# Patient Record
Sex: Female | Born: 1990 | Race: White | Hispanic: No | Marital: Single | Smoking: Former smoker
Health system: Southern US, Community
[De-identification: ages and names within clinical notes are randomized; demographics above are authoritative.]

## PROBLEM LIST (undated history)

## (undated) DIAGNOSIS — R51 Headache: Secondary | ICD-10-CM

## (undated) DIAGNOSIS — N760 Acute vaginitis: Secondary | ICD-10-CM

## (undated) DIAGNOSIS — B9689 Other specified bacterial agents as the cause of diseases classified elsewhere: Secondary | ICD-10-CM

## (undated) DIAGNOSIS — R519 Headache, unspecified: Secondary | ICD-10-CM

## (undated) HISTORY — PX: NO PAST SURGERIES: SHX2092

## (undated) HISTORY — DX: Acute vaginitis: N76.0

## (undated) HISTORY — DX: Other specified bacterial agents as the cause of diseases classified elsewhere: B96.89

## (undated) HISTORY — DX: Headache: R51

## (undated) HISTORY — DX: Headache, unspecified: R51.9

---
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
## 2008-01-01 ENCOUNTER — Inpatient Hospital Stay: Payer: Self-pay | Admitting: Psychiatry

## 2009-02-14 ENCOUNTER — Emergency Department: Payer: Self-pay | Admitting: Emergency Medicine

## 2010-12-20 ENCOUNTER — Emergency Department: Payer: Self-pay | Admitting: Emergency Medicine

## 2011-08-25 ENCOUNTER — Emergency Department: Payer: Self-pay | Admitting: Emergency Medicine

## 2018-08-03 ENCOUNTER — Ambulatory Visit: Payer: Self-pay | Admitting: Nurse Practitioner

## 2018-08-03 ENCOUNTER — Encounter: Payer: Self-pay | Admitting: Nurse Practitioner

## 2018-08-03 ENCOUNTER — Other Ambulatory Visit: Payer: Self-pay

## 2018-08-03 VITALS — BP 109/58 | HR 64 | Temp 99.2°F | Ht 60.0 in | Wt 126.4 lb

## 2018-08-03 DIAGNOSIS — G43829 Menstrual migraine, not intractable, without status migrainosus: Secondary | ICD-10-CM

## 2018-08-03 DIAGNOSIS — L02416 Cutaneous abscess of left lower limb: Secondary | ICD-10-CM

## 2018-08-03 DIAGNOSIS — Z7689 Persons encountering health services in other specified circumstances: Secondary | ICD-10-CM

## 2018-08-03 MED ORDER — BUTALBITAL-APAP-CAFFEINE 50-325-40 MG PO TABS: 1.0000 | ORAL_TABLET | Freq: Four times a day (QID) | ORAL | 1 refills | Status: AC | PRN

## 2018-08-03 MED ORDER — SULFAMETHOXAZOLE-TRIMETHOPRIM 800-160 MG PO TABS: 1.0000 | ORAL_TABLET | Freq: Two times a day (BID) | ORAL | 0 refills | Status: AC

## 2018-08-03 NOTE — Patient Instructions (Addendum)
Margaret Rivera,   Thank you for coming in to clinic today.  1. For your abscess - infected cyst/fluid filled sac. - START Bactrim 800-160 mg take one tablet twice (about every 12 hours) for 10 days. - Let me know if there is heat, redness, "mushy" consistency to the lump, or drainage from your wound.  This needs to be opened and drained.  2. For migraine headaches. - START Fioricet 1-2 tablets every 6 hours. Start with one tablet.  Next headache, may take 2 tabs for your first dose if 1 was not helpful.  - Do not drive with this medication.  Please schedule a follow-up appointment with Wilhelmina Mcardle, AGNP. Return 2-4 weeks if symptoms worsen or fail to improve.  If you have any other questions or concerns, please feel free to call the clinic or send a message through MyChart. You may also schedule an earlier appointment if necessary.  You will receive a survey after today's visit either digitally by e-mail or paper by [employer]. Your experiences and feedback matter to [place].  Please respond so we know how we are doing as we provide care for you.   Wilhelmina Mcardle, DNP, AGNP-BC Adult Gerontology Nurse Practitioner [facility], [facility]

## 2018-08-03 NOTE — Progress Notes (Signed)
Subjective:    Patient ID: Margaret Rivera, female    DOB: Feb 13, 1991, 27 y.o.   MRN: [id number]  Margaret Rivera is a 27 y.o. female presenting on 08/03/2018 for Establish Care (abscess on the back of the left calf x 2 mths. Pt states she thought it was a spider bite it look like a mosquito bite that got bigger w/ pus inside of it. Once it drained it went away and returned after 2-3 weeks. )   HPI Establish Care New Provider Pt last seen by [facility] about 2 years ago.  Obtain records for last PAP.    [place]cess [place] - looked like mosquito bite vs spider bite.  Leaked pus about 3-4 weeks ago and had resolution without any bump.   Has worsened again over last 2-3 weeks.  Now has hard lump again under skin without drainage.  Is concerned about infection.  Migraines Patient reports regular migraines that occur once per month with nausea.  She has other headaches that are mild 1-2 times per week.  Migraines are normally associated with her menses and occasionally with stress.  Past Medical History:  Diagnosis Date  . BV (bacterial vaginosis)   . Frequent headaches    Past Surgical History:  Procedure Laterality Date  . NO PAST SURGERIES       Social History   Socioeconomic History  . Marital status: Single    Spouse name: Not on file  . Number of children: Not on file  . Years of education: Not on file  . Highest education level: Some college, no degree  Occupational History  . Not on file  Social Needs  . Financial resource strain: Not on file  . Food insecurity:    Worry: Not on file    Inability: Not on file  . Transportation needs:    Medical: Not on file    Non-medical: Not on file  Tobacco Use  . Smoking status: Former Smoker    Years: 5.00    Last attempt to quit: 08/03/2013    Years since quitting: 5.0  . Smokeless tobacco: Never Used  Substance and Sexual Activity  . Alcohol use: Yes    Alcohol/week: 3.0  standard drinks    Types: 3 Cans of beer per week    Comment: socially drinks 2-3 beverages, 2-3 x per month  . Drug use: Not Currently    Types: Marijuana    Comment: Quit May 2019  . Sexual activity: Not on file  Lifestyle  . Physical activity:    Days per week: Not on file    Minutes per session: Not on file  . Stress: Not on file  Relationships  . Social connections:    Talks on phone: Not on file    Gets together: Not on file    Attends religious service: Not on file    Active member of club or organization: Not on file    Attends meetings of clubs or organizations: Not on file    Relationship status: Not on file  . Intimate partner violence:    Fear of current or ex partner: No    Emotionally abused: No    Physically abused: No    Forced sexual activity: No  Other Topics Concern  . Not on file  Social History Narrative  . Not on file   Family History  Problem Relation Age of Onset  . Depression Mother   .  Diabetes Maternal Grandmother    No current outpatient medications on file prior to visit.   No current facility-administered medications on file prior to visit.     Review of Systems  Constitutional: Negative for chills and fever.  HENT: Negative for congestion and sore throat.   Eyes: Negative for pain.  Respiratory: Negative for cough, shortness of breath and wheezing.   Cardiovascular: Negative for chest pain, palpitations and leg swelling.  Gastrointestinal: Negative for abdominal pain, blood in stool, constipation, diarrhea, nausea and vomiting.  Endocrine: Negative for polydipsia.  Genitourinary: Negative for dysuria, frequency, hematuria and urgency.  Musculoskeletal: Negative for back pain, myalgias and neck pain.  Skin: Positive for wound (red lump on left leg). Negative for rash.  Allergic/Immunologic: Negative for environmental allergies.  Neurological: Positive for headaches. Negative for dizziness and weakness.  Hematological: Does not  bruise/bleed easily.  Psychiatric/Behavioral: Negative for dysphoric mood and suicidal ideas. The patient is not nervous/anxious.    Per HPI unless specifically indicated above    Objective:    BP (!) 109/58 (BP Location: Right Arm, Patient Position: Sitting, Cuff Size: Normal)   Pulse 64   Temp 99.2 F (37.3 C) (Oral)   Ht 5' (1.524 m)   Wt 126 lb 6.4 oz (57.3 kg)   BMI 24.69 kg/m   Wt Readings from Last 3 Encounters:  08/03/18 126 lb 6.4 oz (57.3 kg)    Physical Exam  Constitutional: She is oriented to person, place, and time. She appears well-developed and well-nourished. No distress.  HENT:  Head: Normocephalic and atraumatic.  Cardiovascular: Normal rate, regular rhythm, S1 normal, S2 normal, normal heart sounds and intact distal pulses.  Pulmonary/Chest: Effort normal and breath sounds normal. No respiratory distress.  Neurological: She is alert and oriented to person, place, and time. She has normal strength and normal reflexes. No cranial nerve deficit or sensory deficit. She displays a negative Romberg sign. Gait normal.  Skin: Skin is warm and dry.     Psychiatric: She has a normal mood and affect. Her behavior is normal.  Vitals reviewed.    No results found for this or any previous visit.    Assessment & Plan:   Problem List Items Addressed This Visit    None    Visit Diagnoses    Encounter to establish care   Previous PCP was many years ago.  Recent care for women's health at health department.  Records will be requested.  Past medical, family, and surgical history reviewed w/ pt.    Menstrual migraine without status migrainosus, not intractable       -  Primary Stable, chronic.  Impacts quality of life at least once monthly.  Has not taken any migraine abortive therapy other than OTC meds in past.  Plan: 1. START Fioricet 1-2 tabs at start of headache.  May repeat in 6 hours.  Max 3 tabs in 24 hours.  Limit to less than once per week. 2. Keep headache log  to track frequency and severity of headaches. 3. Follow-up prn.   Relevant Medications   butalbital-acetaminophen-caffeine (FIORICET, ESGIC) 50-325-40 MG tablet   Abscess of left lower leg     Patient with acute cellulitis, likely recurrent abscess and infection after initial spontaneous resolution.  Plan: 1. START bactrim 800-160mg  one tablet by mouth twice daily (about every 12 hours) for 10 days. 2. May also apply warm compress. 3. Monitor for signs and symptoms of worsening infection. 4. Follow-up 2-4 weeks prn if no improvement.  Discussed may need I&D.   Relevant Medications   sulfamethoxazole-trimethoprim (BACTRIM DS,SEPTRA DS) 800-160 MG tablet      Meds ordered this encounter  Medications  . butalbital-acetaminophen-caffeine (FIORICET, ESGIC) 50-325-40 MG tablet    Sig: Take 1-2 tablets by mouth every 6 (six) hours as needed for migraine.    Dispense:  12 tablet    Refill:  1    May provide a partial fill.    Order Specific Question:   Supervising Provider    Answer:   Smitty Cords [[id number]]  . sulfamethoxazole-trimethoprim (BACTRIM DS,SEPTRA DS) 800-160 MG tablet    Sig: Take 1 tablet by mouth 2 (two) times daily.    Dispense:  20 tablet    Refill:  0    Order Specific Question:   Supervising Provider    Answer:   Smitty Cords [[id number]]     Follow up plan: Return 2-4 weeks if symptoms worsen or fail to improve.  Wilhelmina Mcardle, DNP, AGPCNP-BC Adult Gerontology Primary Care Nurse Practitioner [facility] 08/03/2018, 2:41 PM

## 2018-08-13 ENCOUNTER — Encounter: Payer: Self-pay | Admitting: Nurse Practitioner

## 2021-08-24 ENCOUNTER — Other Ambulatory Visit: Payer: Self-pay | Admitting: Internal Medicine

## 2021-08-24 DIAGNOSIS — G44229 Chronic tension-type headache, not intractable: Secondary | ICD-10-CM

## 2021-08-24 DIAGNOSIS — R400 Somnolence: Secondary | ICD-10-CM

## 2021-08-24 DIAGNOSIS — R42 Dizziness and giddiness: Secondary | ICD-10-CM

## 2021-09-02 ENCOUNTER — Other Ambulatory Visit: Payer: Self-pay

## 2021-09-02 ENCOUNTER — Ambulatory Visit
Admission: RE | Admit: 2021-09-02 | Discharge: 2021-09-02 | Disposition: A | Discharge status: Home / Self Care | Payer: No Typology Code available for payment source | Source: Ambulatory Visit | Attending: Internal Medicine | Admitting: Internal Medicine

## 2021-09-02 DIAGNOSIS — G44229 Chronic tension-type headache, not intractable: Secondary | ICD-10-CM | POA: Diagnosis present

## 2021-09-02 DIAGNOSIS — R42 Dizziness and giddiness: Secondary | ICD-10-CM | POA: Insufficient documentation

## 2021-09-02 DIAGNOSIS — R400 Somnolence: Secondary | ICD-10-CM | POA: Diagnosis present

## 2021-12-21 ENCOUNTER — Other Ambulatory Visit: Payer: Self-pay | Admitting: Unknown Physician Specialty

## 2021-12-21 DIAGNOSIS — J320 Chronic maxillary sinusitis: Secondary | ICD-10-CM

## 2022-01-07 ENCOUNTER — Other Ambulatory Visit: Payer: No Typology Code available for payment source

## 2022-01-08 ENCOUNTER — Other Ambulatory Visit: Payer: No Typology Code available for payment source

## 2022-01-08 ENCOUNTER — Ambulatory Visit
Admission: RE | Admit: 2022-01-08 | Discharge: 2022-01-08 | Disposition: A | Discharge status: Home / Self Care | Payer: No Typology Code available for payment source | Source: Ambulatory Visit | Attending: Unknown Physician Specialty | Admitting: Unknown Physician Specialty

## 2022-01-08 DIAGNOSIS — J320 Chronic maxillary sinusitis: Secondary | ICD-10-CM

## 2022-01-08 MED ORDER — IOPAMIDOL (ISOVUE-300) INJECTION 61%
75.0000 mL | Freq: Once | INTRAVENOUS | Status: AC | PRN
  Administered 2022-01-08: 75 mL via INTRAVENOUS

## 2023-10-11 IMAGING — CT CT MAXILLOFACIAL W/ CM
2 series · 13 of 37 positions shown, 16 images · IV contrast (agent unspecified)
Comparison: Brain MRI 09/02/2021

CLINICAL DATA: Recurrent sinus infections

EXAM:
CT MAXILLOFACIAL WITH CONTRAST
TECHNIQUE: Multidetector CT images of the paranasal sinuses were obtained using
the standard protocol without intravenous contrast.
RADIATION DOSE REDUCTION: This exam was performed according to the
departmental dose-optimization program which includes automated
exposure control, adjustment of the mA and/or kV according to
patient size and/or use of iterative reconstruction technique.

[Series 3: sinus soft · axial · 0.39mm/px · z∈[-198,-104]mm · 10 of 55 slices shown, 13 images (1 of 2)]
[im 4/55  brain]
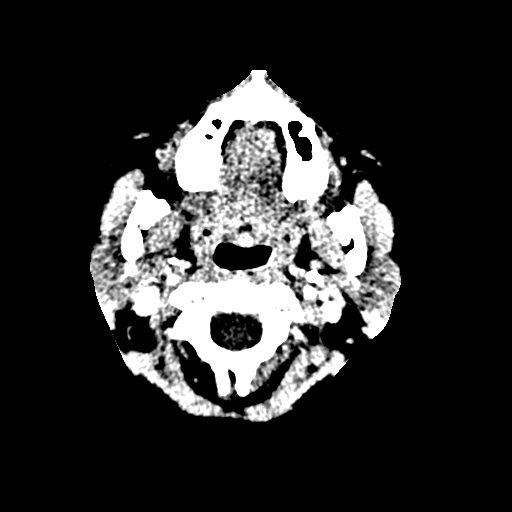
[im 4/55  bone]
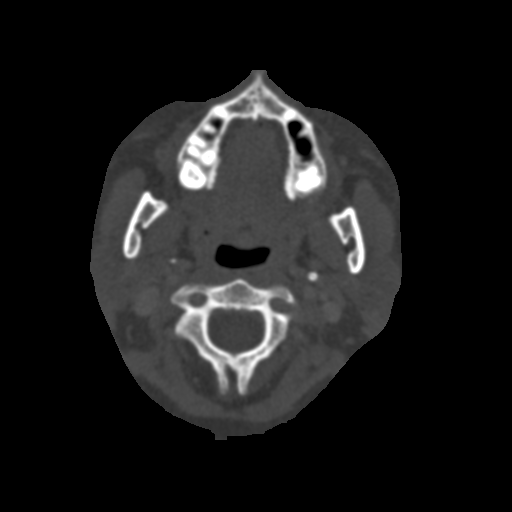
[im 10/55  bone]
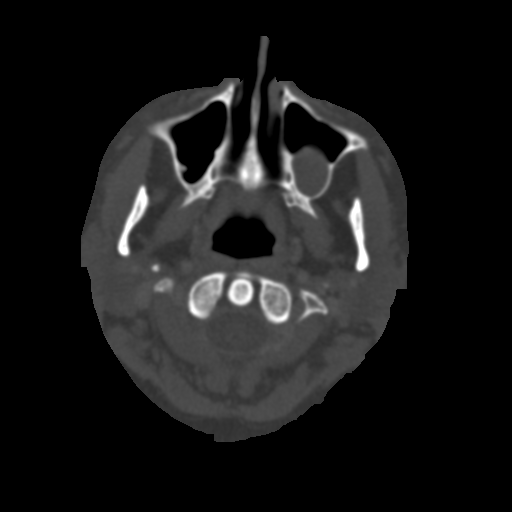
[im 15/55  bone]
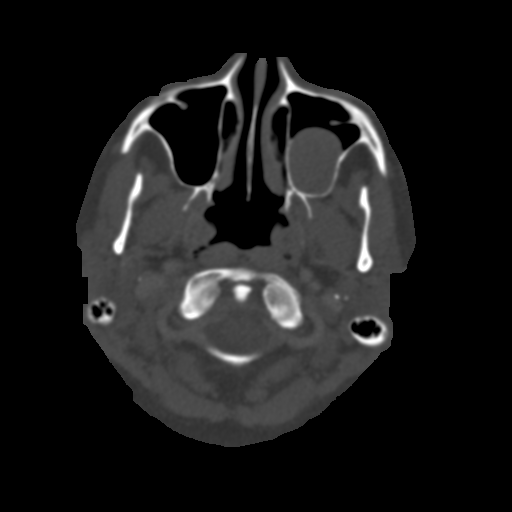
[im 19/55  bone]
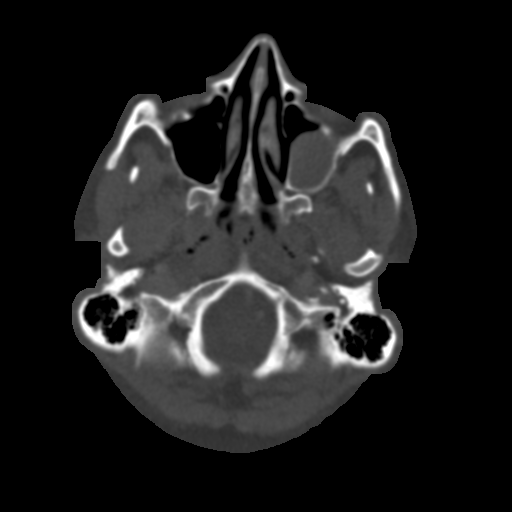
[im 25/55  brain]
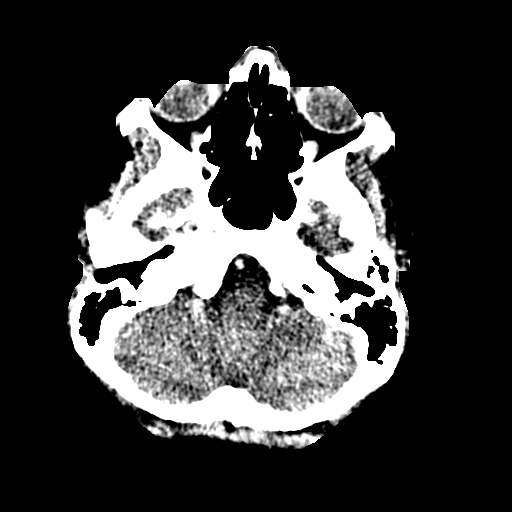
[im 25/55  bone]
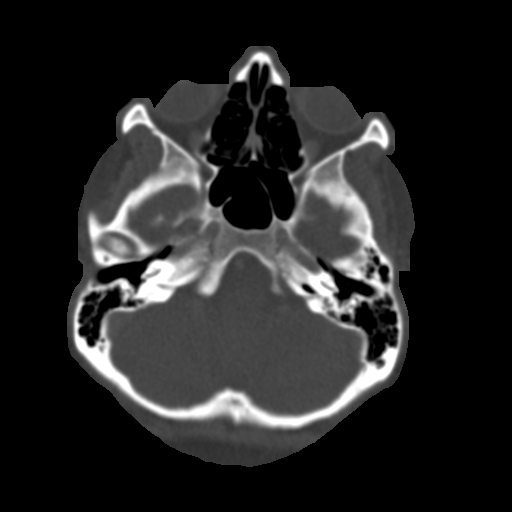
[im 30/55  bone]
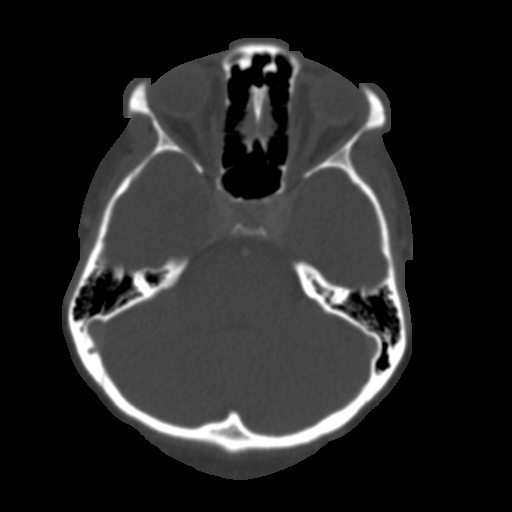
[im 36/55  bone]
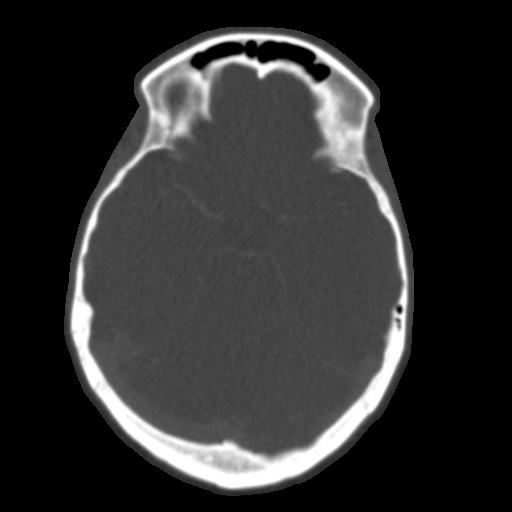
[im 41/55  bone]
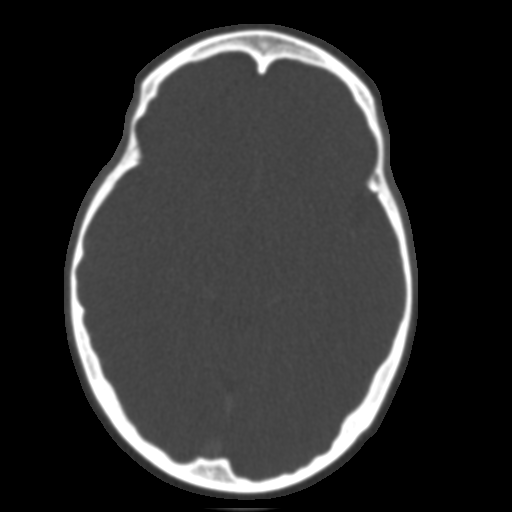
[im 45/55  brain]
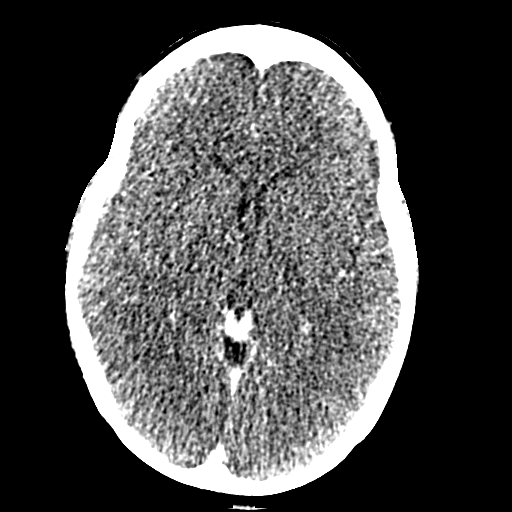
[im 45/55  bone]
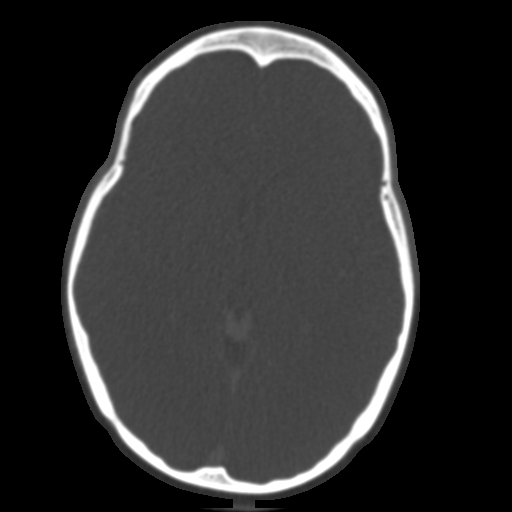
[im 51/55  bone]
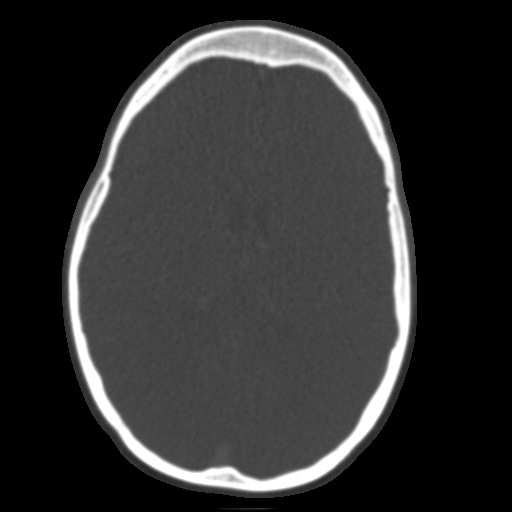

[Series 8: sinus soft · sagittal · 0.21mm/px · 3 of 90 slices shown (2 of 2)]
[im 30/90  bone]
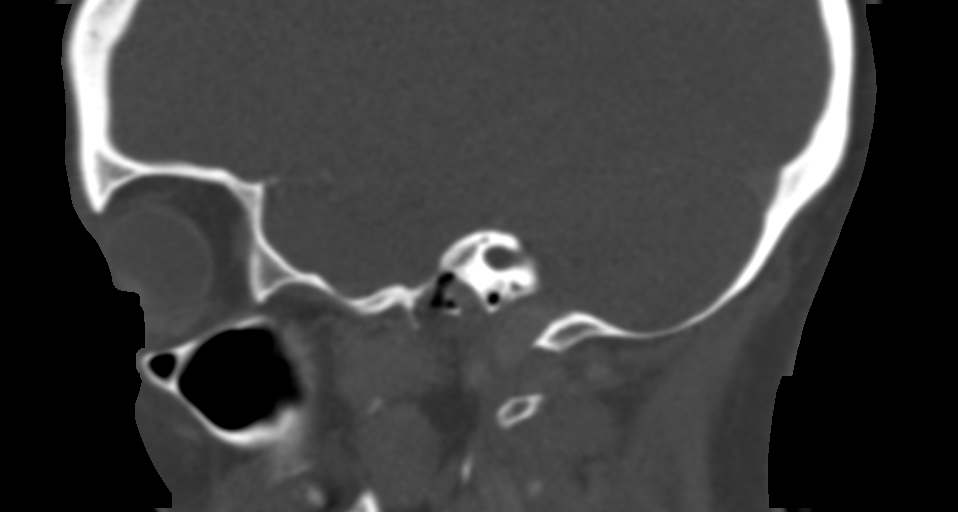
[im 45/90  bone]
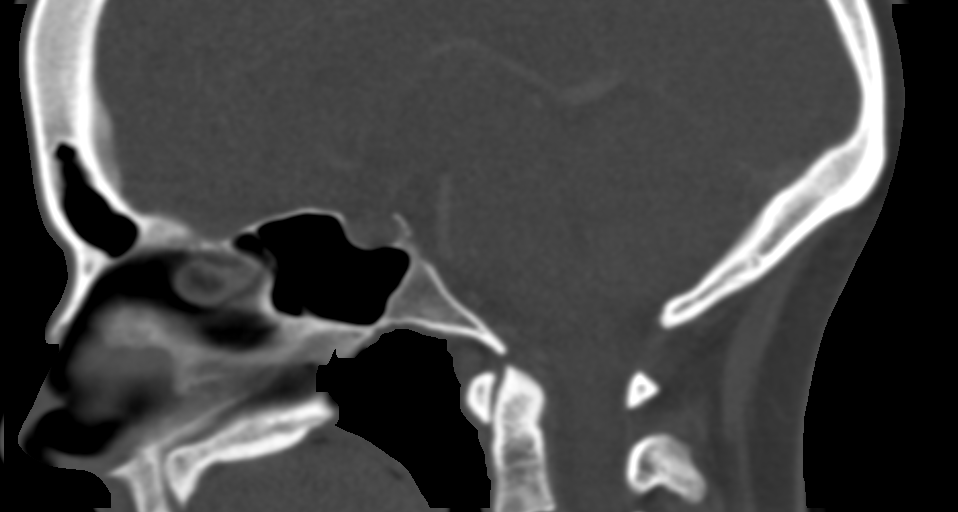
[im 60/90  bone]
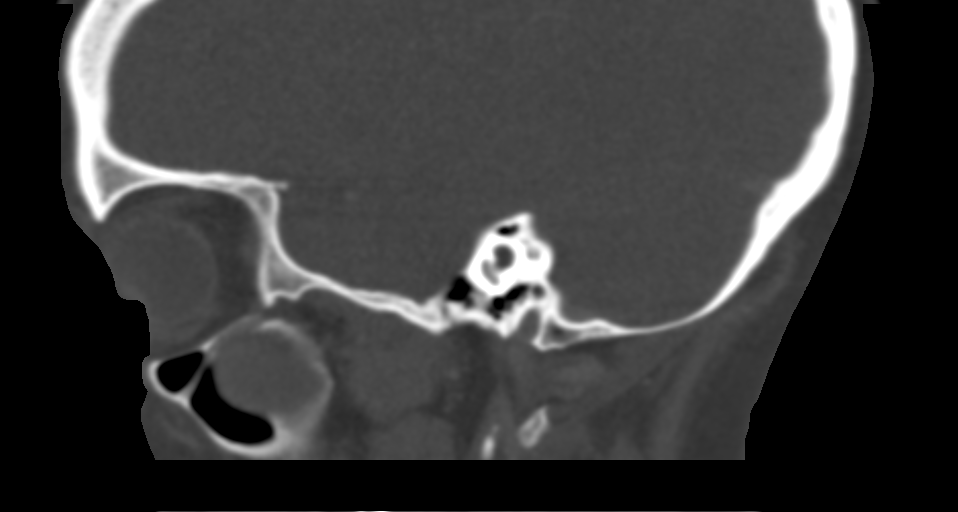

[13 of 37 positions shown; findings below may reference images not displayed]

FINDINGS: Paranasal sinuses:

Frontal: Normally aerated. Patent frontal sinus drainage pathways.

Ethmoid: Normally aerated.

Maxillary: There is a 2.6 cm mucous retention cyst in the left
maxillary sinus, unchanged since the brain MRI from August 2021.
Previously seen mucosal thickening in the maxillary sinuses has
resolved since that study.

Sphenoid: Normally aerated. Patent sphenoethmoidal recesses. The
sphenoid septum inserts on the bony covering of the left carotid
canal.

Right ostiomeatal unit: Patent. An accessory ostium is also patent
(5-29).

Left ostiomeatal unit: Patent.

Nasal passages: Patent. Intact nasal septum is midline.

Anatomy: No pneumatization superior to anterior ethmoid notches.
Symmetric and intact olfactory grooves and fovea ethmoidalis, Keros
II (4-7mm). Sellar sphenoid pneumatization pattern. No dehiscence of
carotid or optic canals. No onodi cell.

Other: Orbits and intracranial compartment are unremarkable. Visible
mastoid air cells are normally aerated.
IMPRESSION: 1. 2.6 cm left maxillary sinus mucous retention cyst, unchanged
compared to the brain MRI from August 2021. Previously seen
mucosal thickening and layering fluid in the maxillary sinuses have
resolved.
2. Otherwise, clear sinuses.
